# Patient Record
Sex: Female | Born: 2000 | Race: Black or African American | Hispanic: No | Marital: Single | Smoking: Never smoker
Health system: Southern US, Community
[De-identification: ages and names within clinical notes are randomized; demographics above are authoritative.]

---
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
## 2019-04-11 LAB — OB RESULTS CONSOLE HIV ANTIBODY (ROUTINE TESTING): HIV: NONREACTIVE

## 2019-04-11 LAB — OB RESULTS CONSOLE GC/CHLAMYDIA
Chlamydia: NEGATIVE
Gonorrhea: NEGATIVE

## 2019-04-11 LAB — OB RESULTS CONSOLE HEPATITIS B SURFACE ANTIGEN: Hepatitis B Surface Ag: NEGATIVE

## 2019-04-11 LAB — OB RESULTS CONSOLE HGB/HCT, BLOOD
HCT: 32 (ref 29–41)
Hemoglobin: 10.5

## 2019-04-11 LAB — OB RESULTS CONSOLE RPR: RPR: NONREACTIVE

## 2019-04-11 LAB — OB RESULTS CONSOLE RUBELLA ANTIBODY, IGM: Rubella: IMMUNE

## 2019-04-11 LAB — OB RESULTS CONSOLE ABO/RH: RH Type: POSITIVE

## 2019-04-11 LAB — OB RESULTS CONSOLE PLATELET COUNT: Platelets: 367

## 2019-05-04 ENCOUNTER — Encounter: Payer: Self-pay | Admitting: Certified Nurse Midwife

## 2019-05-04 ENCOUNTER — Other Ambulatory Visit: Payer: Self-pay

## 2019-05-04 ENCOUNTER — Ambulatory Visit (INDEPENDENT_AMBULATORY_CARE_PROVIDER_SITE_OTHER): Payer: Self-pay | Admitting: Certified Nurse Midwife

## 2019-05-04 VITALS — BP 96/59 | HR 88 | Wt 171.2 lb

## 2019-05-04 DIAGNOSIS — Z87898 Personal history of other specified conditions: Secondary | ICD-10-CM

## 2019-05-04 DIAGNOSIS — O09893 Supervision of other high risk pregnancies, third trimester: Secondary | ICD-10-CM

## 2019-05-04 DIAGNOSIS — Z3403 Encounter for supervision of normal first pregnancy, third trimester: Secondary | ICD-10-CM

## 2019-05-04 DIAGNOSIS — Z832 Family history of diseases of the blood and blood-forming organs and certain disorders involving the immune mechanism: Secondary | ICD-10-CM

## 2019-05-04 DIAGNOSIS — Z789 Other specified health status: Secondary | ICD-10-CM

## 2019-05-04 DIAGNOSIS — O2342 Unspecified infection of urinary tract in pregnancy, second trimester: Secondary | ICD-10-CM

## 2019-05-04 DIAGNOSIS — O99013 Anemia complicating pregnancy, third trimester: Secondary | ICD-10-CM

## 2019-05-04 DIAGNOSIS — Z3A33 33 weeks gestation of pregnancy: Secondary | ICD-10-CM

## 2019-05-04 LAB — POCT URINALYSIS DIPSTICK OB
Bilirubin, UA: NEGATIVE
Blood, UA: NEGATIVE
Glucose, UA: NEGATIVE
Ketones, UA: NEGATIVE
Leukocytes, UA: NEGATIVE
Nitrite, UA: NEGATIVE
POC,PROTEIN,UA: NEGATIVE
Spec Grav, UA: 1.01 (ref 1.010–1.025)
Urobilinogen, UA: 0.2 E.U./dL
pH, UA: 5 (ref 5.0–8.0)

## 2019-05-04 NOTE — Patient Instructions (Signed)
Contraception Choices Contraception, also called birth control, means things to use or ways to try not to get pregnant. Hormonal birth control This kind of birth control uses hormones. Here are some types of hormonal birth control:  A tube that is put under skin of the arm (implant). The tube can stay in for as long as 3 years.  Shots to get every 3 months (injections).  Pills to take every day (birth control pills).  A patch to change 1 time each week for 3 weeks (birth control patch). After that, the patch is taken off for 1 week.  A ring to put in the vagina. The ring is left in for 3 weeks. Then it is taken out of the vagina for 1 week. Then a new ring is put in.  Pills to take after unprotected sex (emergency birth control pills). Barrier birth control Here are some types of barrier birth control:  A thin covering that is put on the penis before sex (female condom). The covering is thrown away after sex.  A soft, loose covering that is put in the vagina before sex (female condom). The covering is thrown away after sex.  A rubber bowl that sits over the cervix (diaphragm). The bowl must be made for you. The bowl is put into the vagina before sex. The bowl is left in for 6-8 hours after sex. It is taken out within 24 hours.  A small, soft cup that fits over the cervix (cervical cap). The cup must be made for you. The cup can be left in for 6-8 hours after sex. It is taken out within 48 hours.  A sponge that is put into the vagina before sex. It must be left in for at least 6 hours after sex. It must be taken out within 30 hours. Then it is thrown away.  A chemical that kills or stops sperm from getting into the uterus (spermicide). It may be a pill, cream, jelly, or foam to put in the vagina. The chemical should be used at least 10-15 minutes before sex. IUD (intrauterine) birth control An IUD is a small, T-shaped piece of plastic. It is put inside the uterus. There are two  kinds:  Hormone IUD. This kind can stay in for 3-5 years.  Copper IUD. This kind can stay in for 10 years. Permanent birth control Here are some types of permanent birth control:  Surgery to block the fallopian tubes.  Having an insert put into each fallopian tube.  Surgery to tie off the tubes that carry sperm (vasectomy). Natural planning birth control Here are some types of natural planning birth control:  Not having sex on the days the woman could get pregnant.  Using a calendar: ? To keep track of the length of each period. ? To find out what days pregnancy can happen. ? To plan to not have sex on days when pregnancy can happen.  Watching for symptoms of ovulation and not having sex during ovulation. One way the woman can check for ovulation is to check her temperature.  Waiting to have sex until after ovulation. Summary  Contraception, also called birth control, means things to use or ways to try not to get pregnant.  Hormonal methods of birth control include implants, injections, pills, patches, vaginal rings, and emergency birth control pills.  Barrier methods of birth control can include female condoms, female condoms, diaphragms, cervical caps, sponges, and spermicides.  There are two types of IUD (intrauterine device) birth control.  An IUD can be put in a woman's uterus to prevent pregnancy for 3-5 years.  Permanent sterilization can be done through a procedure for males, females, or both.  Natural planning methods involve not having sex on the days when the woman could get pregnant. This information is not intended to replace advice given to you by your health care provider. Make sure you discuss any questions you have with your health care provider. Document Revised: 06/08/2018 Document Reviewed: 02/27/2016 Elsevier Patient Education  [place].   Pain Relief During Labor and Delivery Many things can cause pain during labor and delivery,  including:  Pressure on bones and ligaments due to the baby moving through the pelvis.  Stretching of tissues due to the baby moving through the birth canal.  Muscle tension due to anxiety or nervousness.  The uterus tightening (contracting) and relaxing to help move the baby. There are many ways to deal with the pain of labor and delivery. They include:  Taking prenatal classes. Taking these classes helps you know what to expect during your baby's birth. What you learn will increase your confidence and decrease your anxiety.  Practicing relaxation techniques or doing relaxing activities, such as: ? Focused breathing. ? Meditation. ? Visualization. ? Aroma therapy. ? Listening to your favorite music. ? Hypnosis.  Taking a warm shower or bath (hydrotherapy). This may: ? Provide comfort and relaxation. ? Lessen your perception of pain. ? Decrease the amount of pain medicine needed. ? Decrease the length of labor.  Getting a massage or counterpressure on your back.  Applying warm packs or ice packs.  Changing positions often, moving around, or using a birthing ball.  Getting: ? Pain medicine through an IV or injection into a muscle. ? Pain medicine inserted into your spinal column. ? Injections of sterile water just under the skin on your lower back (intradermal injections). ? Laughing gas (nitrous oxide). Discuss your pain control options with your health care provider during your prenatal visits. Explore the options offered by your hospital or birth center. What kinds of medicine are available? There are two kinds of medicines that can be used to relieve pain during labor and delivery:  Analgesics. These medicines decrease pain without causing you to lose feeling or the ability to move your muscles.  Anesthetics. These medicines block feeling in the body and can decrease your ability to move freely. Both of these kinds of medicine can cause minor side effects, such as  nausea, trouble concentrating, and sleepiness. They can also decrease the baby's heart rate before birth and affect the baby's breathing rate after birth. For this reason, health care providers are careful about when and how much medicine is given. What are specific medicines and procedures that provide pain relief? Local Anesthetics Local anesthetics are used to numb a small area of the body. They may be used along with another kind of anesthetic or used to numb the nerves of the vagina, cervix, and perineum during the second stage of labor. General Anesthetics General anesthetics cause you to lose consciousness so you do not feel pain. They are usually only used for an emergency cesarean delivery. General anesthetics are given through an IV tube and a mask. Pudendal Block A pudendal block is a form of local anesthetic. It may be used to relieve the pain associated with pushing or stretching of the perineum at the time of delivery or to further numb the perineum. A pudendal block is done by injecting numbing medicine through  the vaginal wall into a nerve in the pelvis. Epidural Analgesia Epidural analgesia is given through a flexible IV catheter that is inserted into the lower back. Numbing medicine is delivered continuously to the area near your spinal column nerves (epidural space). After having this type of analgesia, you may be able to move your legs but you most likely will not be able to walk. Depending on the amount of medicine given, you may lose all feeling in the lower half of your body, or you may retain some level of sensation, including the urge to push. Epidural analgesia can be used to provide pain relief for a vaginal birth. Spinal Block A spinal block is similar to epidural analgesia, but the medicine is injected into the spinal fluid instead of the epidural space. A spinal block is only given once. It starts to relieve pain quickly, but the pain relief lasts only 1-6 hours. Spinal  blocks can be used for cesarean deliveries. Combined Spinal-Epidural (CSE) Block A CSE block combines the effects of a spinal block and epidural analgesia. The spinal block works quickly to block all pain. The epidural analgesia provides continuous pain relief, even after the effects of the spinal block have worn off. This information is not intended to replace advice given to you by your health care provider. Make sure you discuss any questions you have with your health care provider. Document Revised: 01/29/2017 Document Reviewed: 07/10/2015 Elsevier Patient Education  [place].   Breastfeeding  Choosing to breastfeed is one of the best decisions you can make for yourself and your baby. A change in hormones during pregnancy causes your breasts to make breast milk in your milk-producing glands. Hormones prevent breast milk from being released before your baby is born. They also prompt milk flow after birth. Once breastfeeding has begun, thoughts of your baby, as well as his or her sucking or crying, can stimulate the release of milk from your milk-producing glands. Benefits of breastfeeding Research shows that breastfeeding offers many health benefits for infants and mothers. It also offers a cost-free and convenient way to feed your baby. For your baby  Your first milk (colostrum) helps your baby's digestive system to function better.  Special cells in your milk (antibodies) help your baby to fight off infections.  Breastfed babies are less likely to develop asthma, allergies, obesity, or type 2 diabetes. They are also at lower risk for sudden infant death syndrome (SIDS).  Nutrients in breast milk are better able to meet your baby's needs compared to infant formula.  Breast milk improves your baby's brain development. For you  Breastfeeding helps to create a very special bond between you and your baby.  Breastfeeding is convenient. Breast milk costs nothing and is always  available at the correct temperature.  Breastfeeding helps to burn calories. It helps you to lose the weight that you gained during pregnancy.  Breastfeeding makes your uterus return faster to its size before pregnancy. It also slows bleeding (lochia) after you give birth.  Breastfeeding helps to lower your risk of developing type 2 diabetes, osteoporosis, rheumatoid arthritis, cardiovascular disease, and breast, ovarian, uterine, and endometrial cancer later in life. Breastfeeding basics Starting breastfeeding  Find a comfortable place to sit or lie down, with your neck and back well-supported.  Place a pillow or a rolled-up blanket under your baby to bring him or her to the level of your breast (if you are seated). Nursing pillows are specially designed to help support your arms and  your baby while you breastfeed.  Make sure that your baby's tummy (abdomen) is facing your abdomen.  Gently massage your breast. With your fingertips, massage from the outer edges of your breast inward toward the nipple. This encourages milk flow. If your milk flows slowly, you may need to continue this action during the feeding.  Support your breast with 4 fingers underneath and your thumb above your nipple (make the letter "C" with your hand). Make sure your fingers are well away from your nipple and your baby's mouth.  Stroke your baby's lips gently with your finger or nipple.  When your baby's mouth is open wide enough, quickly bring your baby to your breast, placing your entire nipple and as much of the areola as possible into your baby's mouth. The areola is the colored area around your nipple. ? More areola should be visible above your baby's upper lip than below the lower lip. ? Your baby's lips should be opened and extended outward (flanged) to ensure an adequate, comfortable latch. ? Your baby's tongue should be between his or her lower gum and your breast.  Make sure that your baby's mouth is  correctly positioned around your nipple (latched). Your baby's lips should create a seal on your breast and be turned out (everted).  It is common for your baby to suck about 2-3 minutes in order to start the flow of breast milk. Latching Teaching your baby how to latch onto your breast properly is very important. An improper latch can cause nipple pain, decreased milk supply, and poor weight gain in your baby. Also, if your baby is not latched onto your nipple properly, he or she may swallow some air during feeding. This can make your baby fussy. Burping your baby when you switch breasts during the feeding can help to get rid of the air. However, teaching your baby to latch on properly is still the best way to prevent fussiness from swallowing air while breastfeeding. Signs that your baby has successfully latched onto your nipple  Silent tugging or silent sucking, without causing you pain. Infant's lips should be extended outward (flanged).  Swallowing heard between every 3-4 sucks once your milk has started to flow (after your let-down milk reflex occurs).  Muscle movement above and in front of his or her ears while sucking. Signs that your baby has not successfully latched onto your nipple  Sucking sounds or smacking sounds from your baby while breastfeeding.  Nipple pain. If you think your baby has not latched on correctly, slip your finger into the corner of your baby's mouth to break the suction and place it between your baby's gums. Attempt to start breastfeeding again. Signs of successful breastfeeding Signs from your baby  Your baby will gradually decrease the number of sucks or will completely stop sucking.  Your baby will fall asleep.  Your baby's body will relax.  Your baby will retain a small amount of milk in his or her mouth.  Your baby will let go of your breast by himself or herself. Signs from you  Breasts that have increased in firmness, weight, and size 1-3 hours  after feeding.  Breasts that are softer immediately after breastfeeding.  Increased milk volume, as well as a change in milk consistency and color by the fifth day of breastfeeding.  Nipples that are not sore, cracked, or bleeding. Signs that your baby is getting enough milk  Wetting at least 1-2 diapers during the first 24 hours after birth.  Wetting at least 5-6 diapers every 24 hours for the first week after birth. The urine should be clear or pale yellow by the age of 5 days.  Wetting 6-8 diapers every 24 hours as your baby continues to grow and develop.  At least 3 stools in a 24-hour period by the age of 5 days. The stool should be soft and yellow.  At least 3 stools in a 24-hour period by the age of 7 days. The stool should be seedy and yellow.  No loss of weight greater than 10% of birth weight during the first 3 days of life.  Average weight gain of 4-7 oz (113-198 g) per week after the age of 4 days.  Consistent daily weight gain by the age of 5 days, without weight loss after the age of 2 weeks. After a feeding, your baby may spit up a small amount of milk. This is normal. Breastfeeding frequency and duration Frequent feeding will help you make more milk and can prevent sore nipples and extremely full breasts (breast engorgement). Breastfeed when you feel the need to reduce the fullness of your breasts or when your baby shows signs of hunger. This is called "breastfeeding on demand." Signs that your baby is hungry include:  Increased alertness, activity, or restlessness.  Movement of the head from side to side.  Opening of the mouth when the corner of the mouth or cheek is stroked (rooting).  Increased sucking sounds, smacking lips, cooing, sighing, or squeaking.  Hand-to-mouth movements and sucking on fingers or hands.  Fussing or crying. Avoid introducing a pacifier to your baby in the first 4-6 weeks after your baby is born. After this time, you may choose to use  a pacifier. Research has shown that pacifier use during the first year of a baby's life decreases the risk of sudden infant death syndrome (SIDS). Allow your baby to feed on each breast as long as he or she wants. When your baby unlatches or falls asleep while feeding from the first breast, offer the second breast. Because newborns are often sleepy in the first few weeks of life, you may need to awaken your baby to get him or her to feed. Breastfeeding times will vary from baby to baby. However, the following rules can serve as a guide to help you make sure that your baby is properly fed:  Newborns (babies 50 weeks of age or younger) may breastfeed every 1-3 hours.  Newborns should not go without breastfeeding for longer than 3 hours during the day or 5 hours during the night.  You should breastfeed your baby a minimum of 8 times in a 24-hour period. Breast milk pumping     Pumping and storing breast milk allows you to make sure that your baby is exclusively fed your breast milk, even at times when you are unable to breastfeed. This is especially important if you go back to work while you are still breastfeeding, or if you are not able to be present during feedings. Your lactation consultant can help you find a method of pumping that works best for you and give you guidelines about how long it is safe to store breast milk. Caring for your breasts while you breastfeed Nipples can become dry, cracked, and sore while breastfeeding. The following recommendations can help keep your breasts moisturized and healthy:  Avoid using soap on your nipples.  Wear a supportive bra designed especially for nursing. Avoid wearing underwire-style bras or extremely tight bras (sports bras).  Air-dry your nipples for 3-4 minutes after each feeding.  Use only cotton bra pads to absorb leaked breast milk. Leaking of breast milk between feedings is normal.  Use lanolin on your nipples after breastfeeding. Lanolin  helps to maintain your skin's normal moisture barrier. Pure lanolin is not harmful (not toxic) to your baby. You may also hand express a few drops of breast milk and gently massage that milk into your nipples and allow the milk to air-dry. In the first few weeks after giving birth, some women experience breast engorgement. Engorgement can make your breasts feel heavy, warm, and tender to the touch. Engorgement peaks within 3-5 days after you give birth. The following recommendations can help to ease engorgement:  Completely empty your breasts while breastfeeding or pumping. You may want to start by applying warm, moist heat (in the shower or with warm, water-soaked hand towels) just before feeding or pumping. This increases circulation and helps the milk flow. If your baby does not completely empty your breasts while breastfeeding, pump any extra milk after he or she is finished.  Apply ice packs to your breasts immediately after breastfeeding or pumping, unless this is too uncomfortable for you. To do this: ? Put ice in a plastic bag. ? Place a towel between your skin and the bag. ? Leave the ice on for 20 minutes, 2-3 times a day.  Make sure that your baby is latched on and positioned properly while breastfeeding. If engorgement persists after 48 hours of following these recommendations, contact your health care provider or a Science writer. Overall health care recommendations while breastfeeding  Eat 3 healthy meals and 3 snacks every day. Well-nourished mothers who are breastfeeding need an additional 450-500 calories a day. You can meet this requirement by increasing the amount of a balanced diet that you eat.  Drink enough water to keep your urine pale yellow or clear.  Rest often, relax, and continue to take your prenatal vitamins to prevent fatigue, stress, and low vitamin and mineral levels in your body (nutrient deficiencies).  Do not use any products that contain nicotine or  tobacco, such as cigarettes and e-cigarettes. Your baby may be harmed by chemicals from cigarettes that pass into breast milk and exposure to secondhand smoke. If you need help quitting, ask your health care provider.  Avoid alcohol.  Do not use illegal drugs or marijuana.  Talk with your health care provider before taking any medicines. These include over-the-counter and prescription medicines as well as vitamins and herbal supplements. Some medicines that may be harmful to your baby can pass through breast milk.  It is possible to become pregnant while breastfeeding. If birth control is desired, ask your health care provider about options that will be safe while breastfeeding your baby. Where to find more information: [employer]: www.llli.org Contact a health care provider if:  You feel like you want to stop breastfeeding or have become frustrated with breastfeeding.  Your nipples are cracked or bleeding.  Your breasts are red, tender, or warm.  You have: ? Painful breasts or nipples. ? A swollen area on either breast. ? A fever or chills. ? Nausea or vomiting. ? Drainage other than breast milk from your nipples.  Your breasts do not become full before feedings by the fifth day after you give birth.  You feel sad and depressed.  Your baby is: ? Too sleepy to eat well. ? Having trouble sleeping. ? More than 12 week old and wetting  fewer than 6 diapers in a 24-hour period. ? Not gaining weight by 21 days of age.  Your baby has fewer than 3 stools in a 24-hour period.  Your baby's skin or the white parts of his or her eyes become yellow. Get help right away if:  Your baby is overly tired (lethargic) and does not want to wake up and feed.  Your baby develops an unexplained fever. Summary  Breastfeeding offers many health benefits for infant and mothers.  Try to breastfeed your infant when he or she shows early signs of hunger.  Gently tickle or stroke  your baby's lips with your finger or nipple to allow the baby to open his or her mouth. Bring the baby to your breast. Make sure that much of the areola is in your baby's mouth. Offer one side and burp the baby before you offer the other side.  Talk with your health care provider or lactation consultant if you have questions or you face problems as you breastfeed. This information is not intended to replace advice given to you by your health care provider. Make sure you discuss any questions you have with your health care provider. Document Revised: 05/13/2017 Document Reviewed: 03/20/2016 Elsevier Patient Education  2020 [employer].   [facility]  [place], [place], [place] [address]  Phone: [phone]   [facility] (second location)  [address] [place], [place] [address]  Phone: [phone]   [facility]) [place], [place], [place] [address] Phone: [phone]   [address] [place], [place] [address]  Phone: [phone]  Fetal Movement Counts Patient Name: ________________________________________________ Patient Due Date: ____________________ What is a fetal movement count?  A fetal movement count is the number of times that you feel your baby move during a certain amount of time. This may also be called a fetal kick count. A fetal movement count is recommended for every pregnant woman. You may be asked to start counting fetal movements as early as week 28 of your pregnancy. Pay attention to when your baby is most active. You may notice your baby's sleep and wake cycles. You may also notice things that make your baby move more. You should do a fetal movement count:  When your baby is normally most active.  At the same time each day. A good time to count movements is while you are resting, after having something to eat and drink. How do I count fetal  movements? 1. Find a quiet, comfortable area. Sit, or lie down on your side. 2. Write down the date, the start time and stop time, and the number of movements that you felt between those two times. Take this information with you to your health care visits. 3. Write down your start time when you feel the first movement. 4. Count kicks, flutters, swishes, rolls, and jabs. You should feel at least 10 movements. 5. You may stop counting after you have felt 10 movements, or if you have been counting for 2 hours. Write down the stop time. 6. If you do not feel 10 movements in 2 hours, contact your health care provider for further instructions. Your health care provider may want to do additional tests to assess your baby's well-being. Contact a health care provider if:  You feel fewer than 10 movements in 2 hours.  Your baby is not moving like he or she usually does. Date: ____________  Start time: ____________ Stop time: ____________ Movements: ____________ Date: ____________ Start time: ____________ Stop time: ____________ Movements: ____________ Date: ____________ Start time: ____________ Stop time: ____________ Movements: ____________ Date: ____________ Start time: ____________ Stop time: ____________ Movements: ____________ Date: ____________ Start time: ____________ Stop time: ____________ Movements: ____________ Date: ____________ Start time: ____________ Stop time: ____________ Movements: ____________ Date: ____________ Start time: ____________ Stop time: ____________ Movements: ____________ Date: ____________ Start time: ____________ Stop time: ____________ Movements: ____________ Date: ____________ Start time: ____________ Stop time: ____________ Movements: ____________ This information is not intended to replace advice given to you by your health care provider. Make sure you discuss any questions you have with your health care provider. Document Revised: 10/06/2018 Document Reviewed:  10/06/2018 Elsevier Patient Education  [place]. Common Medications Safe in Pregnancy  Acne:      Constipation:  Benzoyl Peroxide     Colace  Clindamycin      Dulcolax Suppository  Topica Erythromycin     Fibercon  Salicylic Acid      Metamucil         Miralax AVOID:        Senakot   Accutane    Cough:  Retin-A       Cough Drops  Tetracycline      Phenergan w/ Codeine if Rx  Minocycline      Robitussin (Plain & DM)  Antibiotics:     Crabs/Lice:  Ceclor       RID  Cephalosporins    AVOID:  E-Mycins      Kwell  Keflex  Macrobid/Macrodantin   Diarrhea:  Penicillin      Kao-Pectate  Zithromax      Imodium AD         PUSH FLUIDS AVOID:       Cipro     Fever:  Tetracycline      Tylenol (Regular or Extra  Minocycline       Strength)  Levaquin      Extra Strength-Do not          Exceed 8 tabs/24 hrs Caffeine:        '200mg'$ /day (equiv. To 1 cup of coffee or  approx. 3 12 oz sodas)         Gas: Cold/Hayfever:       Gas-X  Benadryl      Mylicon  Claritin       Phazyme  **Claritin-D        Chlor-Trimeton    Headaches:  Dimetapp      ASA-Free Excedrin  Drixoral-Non-Drowsy     Cold Compress  Mucinex (Guaifenasin)     Tylenol (Regular or Extra  Sudafed/Sudafed-12 Hour     Strength)  **Sudafed PE Pseudoephedrine   Tylenol Cold & Sinus     Vicks Vapor Rub  Zyrtec  **AVOID if Problems With Blood Pressure         Heartburn: Avoid lying down for at least 1 hour after meals  Aciphex      Maalox     Rash:  Milk of Magnesia     Benadryl    Mylanta       1% Hydrocortisone Cream  Pepcid  Pepcid Complete   Sleep Aids:  Prevacid      Ambien   Prilosec       Benadryl  Rolaids       Chamomile Tea  Tums (Limit 4/day)     Unisom  Zantac       Tylenol PM  Warm milk-add vanilla or  Hemorrhoids:       Sugar for taste  Anusol/Anusol H.C.  (RX: Analapram 2.5%)  Sugar Substitutes:  Hydrocortisone OTC     Ok in moderation  Preparation  H      Tucks        Vaseline lotion applied to tissue with wiping    Herpes:     Throat:  Acyclovir      Oragel  Famvir  Valtrex     Vaccines:         Flu Shot Leg Cramps:       *Gardasil  Benadryl      Hepatitis A         Hepatitis B Nasal Spray:       Pneumovax  Saline Nasal Spray     Polio Booster         Tetanus Nausea:       Tuberculosis test or PPD  Vitamin B6 25 mg TID   AVOID:    Dramamine      *Gardasil  Emetrol       Live Poliovirus  Ginger Root 250 mg QID    MMR (measles, mumps &  High Complex Carbs @ Bedtime    rebella)  Sea Bands-Accupressure    Varicella (Chickenpox)  Unisom 1/2 tab TID     *No known complications           If received before Pain:         Known pregnancy;   Darvocet       Resume series after  Lortab        Delivery  Percocet    Yeast:   Tramadol      Femstat  Tylenol 3      Gyne-lotrimin  Ultram       Monistat  Vicodin           MISC:         All Sunscreens           Hair Coloring/highlights          Insect Repellant's          (Including DEET)         Mystic Tans Third Trimester of Pregnancy  The third trimester is from week 28 through week 40 (months 7 through 9). This trimester is when your unborn baby (fetus) is growing very fast. At the end of the ninth month, the unborn baby is about 20 inches in length. It weighs about 6-10 pounds. Follow these instructions at home: Medicines  Take over-the-counter and prescription medicines only as told by your doctor. Some medicines are safe and some medicines are not safe during pregnancy.  Take a prenatal vitamin that contains at least 600 micrograms (mcg) of folic acid.  If you have trouble pooping (constipation), take medicine that will make your stool soft (stool softener) if your doctor approves. Eating and drinking   Eat regular, healthy meals.  Avoid raw meat and uncooked cheese.  If you get low calcium from the food you eat, talk to your doctor about taking a daily calcium  supplement.  Eat four or five small meals rather than three large meals a day.  Avoid foods that are high in fat and sugars, such as fried and sweet foods.  To prevent constipation: ? Eat foods that are high in fiber, like fresh fruits and vegetables, whole grains, and beans. ? Drink enough fluids to keep your pee (urine) clear or pale yellow.  Activity  Exercise only as told by your doctor. Stop exercising if you start to have cramps.  Avoid heavy lifting, wear low heels, and sit up straight.  Do not exercise if it is too hot, too humid, or if you are in a place of great height (high altitude).  You may continue to have sex unless your doctor tells you not to. Relieving pain and discomfort  Wear a good support bra if your breasts are tender.  Take frequent breaks and rest with your legs raised if you have leg cramps or low back pain.  Take warm water baths (sitz baths) to soothe pain or discomfort caused by hemorrhoids. Use hemorrhoid cream if your doctor approves.  If you develop puffy, bulging veins (varicose veins) in your legs: ? Wear support hose or compression stockings as told by your doctor. ? Raise (elevate) your feet for 15 minutes, 3-4 times a day. ? Limit salt in your food. Safety  Wear your seat belt when driving.  Make a list of emergency phone numbers, including numbers for family, friends, the hospital, and police and fire departments. Preparing for your baby's arrival To prepare for the arrival of your baby:  Take prenatal classes.  Practice driving to the hospital.  Visit the hospital and tour the maternity area.  Talk to your work about taking leave once the baby comes.  Pack your hospital bag.  Prepare the baby's room.  Go to your doctor visits.  Buy a rear-facing car seat. Learn how to install it in your car. General instructions  Do not use hot tubs, steam rooms, or saunas.  Do not use any products that contain nicotine or tobacco, such  as cigarettes and e-cigarettes. If you need help quitting, ask your doctor.  Do not drink alcohol.  Do not douche or use tampons or scented sanitary pads.  Do not cross your legs for long periods of time.  Do not travel for long distances unless you must. Only do so if your doctor says it is okay.  Visit your dentist if you have not gone during your pregnancy. Use a soft toothbrush to brush your teeth. Be gentle when you floss.  Avoid cat litter boxes and soil used by cats. These carry germs that can cause birth defects in the baby and can cause a loss of your baby (miscarriage) or stillbirth.  Keep all your prenatal visits as told by your doctor. This is important. Contact a doctor if:  You are not sure if you are in labor or if your water has broken.  You are dizzy.  You have mild cramps or pressure in your lower belly.  You have a nagging pain in your belly area.  You continue to feel sick to your stomach, you throw up, or you have watery poop.  You have bad smelling fluid coming from your vagina.  You have pain when you pee. Get help right away if:  You have a fever.  You are leaking fluid from your vagina.  You are spotting or bleeding from your vagina.  You have severe belly cramps or pain.  You lose or gain weight quickly.  You have trouble catching your breath and have chest pain.  You notice sudden or extreme puffiness (swelling) of your face, hands, ankles, feet, or legs.  You have not felt the baby move in over an hour.  You have severe headaches that do not go away with medicine.  You have trouble seeing.  You are  leaking, or you are having a gush of fluid, from your vagina before you are 37 weeks.  You have regular belly spasms (contractions) before you are 37 weeks. Summary  The third trimester is from week 28 through week 40 (months 7 through 9). This time is when your unborn baby is growing very fast.  Follow your doctor's advice about  medicine, food, and activity.  Get ready for the arrival of your baby by taking prenatal classes, getting all the baby items ready, preparing the baby's room, and visiting your doctor to be checked.  Get help right away if you are bleeding from your vagina, or you have chest pain and trouble catching your breath, or if you have not felt your baby move in over an hour. This information is not intended to replace advice given to you by your health care provider. Make sure you discuss any questions you have with your health care provider. Document Revised: 06/09/2018 Document Reviewed: 03/24/2016 Elsevier Patient Education  [place].

## 2019-05-05 ENCOUNTER — Telehealth: Payer: Self-pay | Admitting: Certified Nurse Midwife

## 2019-05-05 ENCOUNTER — Other Ambulatory Visit: Payer: Self-pay

## 2019-05-05 MED ORDER — METRONIDAZOLE 0.75 % VA GEL: 1.0000 | Freq: Every day | VAGINAL | 1 refills | Status: DC

## 2019-05-05 NOTE — Telephone Encounter (Signed)
Please send Metrogel to pharmacy as requested. Thanks, JML

## 2019-05-05 NOTE — Telephone Encounter (Signed)
Per mychart request- metrogel sent to [facility] in [place] per Claretta Fraise request

## 2019-05-05 NOTE — Telephone Encounter (Signed)
Patient called saying Kendra Hunt prescribed her a gel for bacterial infections but patient left without asking what pharmacy it was sent to. The patient said she would prefer it be sent to the [facility] pharmacy in [place].

## 2019-05-07 DIAGNOSIS — Z87898 Personal history of other specified conditions: Secondary | ICD-10-CM | POA: Insufficient documentation

## 2019-05-07 DIAGNOSIS — O99013 Anemia complicating pregnancy, third trimester: Secondary | ICD-10-CM | POA: Insufficient documentation

## 2019-05-07 DIAGNOSIS — Z832 Family history of diseases of the blood and blood-forming organs and certain disorders involving the immune mechanism: Secondary | ICD-10-CM | POA: Insufficient documentation

## 2019-05-07 DIAGNOSIS — O2342 Unspecified infection of urinary tract in pregnancy, second trimester: Secondary | ICD-10-CM | POA: Insufficient documentation

## 2019-05-07 DIAGNOSIS — Z789 Other specified health status: Secondary | ICD-10-CM | POA: Insufficient documentation

## 2019-05-07 NOTE — Progress Notes (Signed)
TRANSFER IN OB HISTORY AND PHYSICAL  SUBJECTIVE:       Kendra Hunt is a 19 y.o. G1P0 female, Patient's last menstrual period was 09/11/2018., Estimated Date of Delivery: 06/19/19, [redacted]w[redacted]d, presents today for Transition of Prenatal Care. EPIC data migration from outside records is accomplished today.  Relocated from [place], [place] to [place] to be closer to mother and family.   Denies difficulty breathing or respiratory distress, chest pain, abdominal pain, dysuria, vaginal bleeding, and leg pain or swelling.   Requests prescription for Metrogel. Desires midwifery care.    Gynecologic History  Patient's last menstrual period was 09/11/2018.   Contraception: none  Last Pap:N/A  Obstetric History  OB History  Gravida Para Term Preterm AB Living  1            SAB TAB Ectopic Multiple Live Births               # Outcome Date GA Lbr Len/2nd Weight Sex Delivery Anes PTL Lv  1 Current             History reviewed. No pertinent past medical history.  History reviewed. No pertinent surgical history.  Current Outpatient Medications on File Prior to Visit  Medication Sig Dispense Refill  . cephALEXin (KEFLEX) 500 MG capsule Take 1 capsule (500 mg total) by mouth 3 (three) times daily. 21 capsule 0  . ondansetron (ZOFRAN ODT) 4 MG disintegrating tablet Take 1 tablet (4 mg total) by mouth every 8 (eight) hours as needed for nausea or vomiting. 20 tablet 0  . promethazine (PHENERGAN) 12.5 MG tablet Take 1 tablet (12.5 mg total) by mouth every 6 (six) hours as needed for nausea or vomiting. 30 tablet 0   No current facility-administered medications on file prior to visit.    No Known Allergies  Social History   Socioeconomic History  . Marital status: Single    Spouse name: Not on file  . Number of children: Not on file  . Years of education: Not on file  . Highest education level: Not on file  Occupational History  . Not on file  Tobacco Use  . Smoking status: Never Smoker   . Smokeless tobacco: Never Used  Substance and Sexual Activity  . Alcohol use: Never  . Drug use: Never  . Sexual activity: Yes    Birth control/protection: None  Other Topics Concern  . Not on file  Social History Narrative  . Not on file   Social Determinants of Health   Financial Resource Strain:   . Difficulty of Paying Living Expenses: Not on file  Food Insecurity:   . Worried About Charity fundraiser in the Last Year: Not on file  . Ran Out of Food in the Last Year: Not on file  Transportation Needs:   . Lack of Transportation (Medical): Not on file  . Lack of Transportation (Non-Medical): Not on file  Physical Activity:   . Days of Exercise per Week: Not on file  . Minutes of Exercise per Session: Not on file  Stress:   . Feeling of Stress : Not on file  Social Connections:   . Frequency of Communication with Friends and Family: Not on file  . Frequency of Social Gatherings with Friends and Family: Not on file  . Attends Religious Services: Not on file  . Active Member of Clubs or Organizations: Not on file  . Attends Archivist Meetings: Not on file  . Marital Status: Not on file  Intimate Partner Violence:   . Fear of Current or Ex-Partner: Not on file  . Emotionally Abused: Not on file  . Physically Abused: Not on file  . Sexually Abused: Not on file    History reviewed. No pertinent family history.  The following portions of the patient's history were reviewed and updated as appropriate: allergies, current medications, past OB history, past medical history, past surgical history, past family history, past social history, and problem list.  Review of Systems:  ROS negative except as noted above. Information obtained from patient.   OBJECTIVE:  BP (!) 96/59   Pulse 88   Wt 171 lb 3 oz (77.7 kg)   LMP 09/11/2018   BMI 27.63 kg/m   Initial Physical Exam (New OB)  GENERAL APPEARANCE: alert, well appearing, in no apparent distress  HEAD:  normocephalic, atraumatic  MOUTH: mucous membranes moist, pharynx normal without lesions  THYROID: no thyromegaly or masses present  BREASTS: deferred, no complaints  LUNGS: clear to auscultation, no wheezes, rales or rhonchi, symmetric air entry  HEART: regular rate and rhythm, no murmurs  ABDOMEN: soft, nontender, nondistended, no abnormal masses, no epigastric pain, fundus soft, nontender 34 cm and FHT present  EXTREMITIES: no redness or tenderness in the calves or thighs, no edema  SKIN: normal coloration and turgor, no rashes, tattoos present  LYMPH NODES: no adenopathy palpable  NEUROLOGIC: alert, oriented, normal speech, no focal findings or movement disorder noted  PELVIC EXAM: declined  ASSESSMENT: Normal pregnancy, desires midwifery care Rh positive Anemia in pregnancy History of UTI History nonepileptic seizures Family history of sickle cell trait Varicella immunization status unknown  PLAN: Prenatal care New OB counseling: The patient has been given an overview regarding routine prenatal care. Recommendations regarding diet, weight gain, and exercise in pregnancy were given. Prenatal testing, optional genetic testing, and ultrasound use in pregnancy were reviewed.  Benefits of Breast Feeding were discussed. The patient is encouraged to consider nursing her baby post partum. See orders   Pregnancy Medical Home Form completed.    A total of 20 minutes were spent face-to-face with the patient during the encounter with greater than 50% dealing with counseling and coordination of care.

## 2019-05-08 ENCOUNTER — Other Ambulatory Visit (INDEPENDENT_AMBULATORY_CARE_PROVIDER_SITE_OTHER): Payer: Self-pay | Admitting: Certified Nurse Midwife

## 2019-05-08 DIAGNOSIS — Z3689 Encounter for other specified antenatal screening: Secondary | ICD-10-CM

## 2019-05-08 DIAGNOSIS — Z3403 Encounter for supervision of normal first pregnancy, third trimester: Secondary | ICD-10-CM

## 2019-05-08 NOTE — Progress Notes (Signed)
Order placed for follow up ultrasound due to incomplete outside anatomy scan.    Gunnar Bulla, CNM [facility], [facility] 05/08/19 10:12 AM

## 2019-05-08 NOTE — Telephone Encounter (Signed)
Please contact patient to add ultrasound for incomplete views of fetal heart and nasal spine on anatomy scan with next visit. Order is in. Thanks, JML

## 2019-05-17 ENCOUNTER — Other Ambulatory Visit: Payer: Self-pay

## 2019-05-17 ENCOUNTER — Encounter: Payer: Self-pay | Admitting: Certified Nurse Midwife

## 2019-05-17 ENCOUNTER — Ambulatory Visit: Payer: Self-pay

## 2019-05-17 ENCOUNTER — Ambulatory Visit (INDEPENDENT_AMBULATORY_CARE_PROVIDER_SITE_OTHER): Payer: Self-pay | Admitting: Certified Nurse Midwife

## 2019-05-17 VITALS — BP 121/73 | HR 74 | Wt 173.4 lb

## 2019-05-17 DIAGNOSIS — Z3403 Encounter for supervision of normal first pregnancy, third trimester: Secondary | ICD-10-CM

## 2019-05-17 DIAGNOSIS — Z3A35 35 weeks gestation of pregnancy: Secondary | ICD-10-CM

## 2019-05-17 DIAGNOSIS — Z23 Encounter for immunization: Secondary | ICD-10-CM

## 2019-05-17 LAB — POCT URINALYSIS DIPSTICK OB
Bilirubin, UA: NEGATIVE
Blood, UA: NEGATIVE
Glucose, UA: NEGATIVE
Ketones, UA: NEGATIVE
Nitrite, UA: NEGATIVE
POC,PROTEIN,UA: NEGATIVE
Spec Grav, UA: 1.01 (ref 1.010–1.025)
Urobilinogen, UA: 0.2 E.U./dL
pH, UA: 5 (ref 5.0–8.0)

## 2019-05-17 MED ORDER — TETANUS-DIPHTH-ACELL PERTUSSIS 5-2.5-18.5 LF-MCG/0.5 IM SUSP
0.5000 mL | Freq: Once | INTRAMUSCULAR | Status: AC
  Administered 2019-05-17: 0.5 mL via INTRAMUSCULAR

## 2019-05-17 NOTE — Patient Instructions (Signed)
Group B Streptococcus Infection During Pregnancy °Group B Streptococcus (GBS) is a type of bacteria that is often found in healthy people. It is commonly found in the rectum, vagina, and intestines. In people who are healthy and not pregnant, the bacteria rarely cause serious illness or complications. However, women who test positive for GBS during pregnancy can pass the bacteria to the baby during childbirth. This can cause serious infection in the baby after birth. °Women with GBS may also have infections during their pregnancy or soon after childbirth. The infections include urinary tract infections (UTIs) or infections of the uterus. GBS also increases a woman's risk of complications during pregnancy, such as early labor or delivery, miscarriage, or stillbirth. Routine testing for GBS is recommended for all pregnant women. °What are the causes? °This condition is caused by bacteria called Streptococcus agalactiae. °What increases the risk? °You may have a higher risk for GBS infection during pregnancy if you had one during a past pregnancy. °What are the signs or symptoms? °In most cases, GBS infection does not cause symptoms in pregnant women. If symptoms exist, they may include: °· Labor that starts before the 37th week of pregnancy. °· A UTI or bladder infection. This may cause a fever, frequent urination, or pain and burning during urination. °· Fever during labor. There can also be a rapid heartbeat in the mother or baby. °Rare but serious symptoms of a GBS infection in women include: °· Blood infection (septicemia). This may cause fever, chills, or confusion. °· Lung infection (pneumonia). This may cause fever, chills, cough, rapid breathing, chest pain, or difficulty breathing. °· Bone, joint, skin, or soft tissue infection. °How is this diagnosed? °You may be screened for GBS between week 35 and week 37 of pregnancy. If you have symptoms of preterm labor, you may be screened earlier. This condition is  diagnosed based on lab test results from: °· A swab of fluid from the vagina and rectum. °· A urine sample. °How is this treated? °This condition is treated with antibiotic medicine. Antibiotic medicine may be given: °· To you when you go into labor, or as soon as your water breaks. The medicines will continue until after you give birth. If you are having a cesarean delivery, you do not need antibiotics unless your water has broken. °· To your baby, if he or she requires treatment. Your health care provider will check your baby to decide if he or she needs antibiotics to prevent a serious infection. °Follow these instructions at home: °· Take over-the-counter and prescription medicines only as told by your health care provider. °· Take your antibiotic medicine as told by your health care provider. Do not stop taking the antibiotic even if you start to feel better. °· Keep all pre-birth (prenatal) visits and follow-up visits as told by your health care provider. This is important. °Contact a health care provider if: °· You have pain or burning when you urinate. °· You have to urinate more often than usual. °· You have a fever or chills. °· You develop a bad-smelling vaginal discharge. °Get help right away if: °· Your water breaks. °· You go into labor. °· You have severe pain in your abdomen. °· You have difficulty breathing. °· You have chest pain. °These symptoms may represent a serious problem that is an emergency. Do not wait to see if the symptoms will go away. Get medical help right away. Call your local emergency services (911 in the [place] Do not drive yourself to   the hospital. Summary  GBS is a type of bacteria that is common in healthy people.  During pregnancy, colonization with GBS can cause serious complications for you or your baby.  Your health care provider will screen you between 35 and 37 weeks of pregnancy to determine if you are colonized with GBS.  If you are colonized with GBS during  pregnancy, your health care provider will recommend antibiotics through an IV during labor.  After delivery, your baby will be evaluated for complications related to potential GBS infection and may require antibiotics to prevent a serious infection. This information is not intended to replace advice given to you by your health care provider. Make sure you discuss any questions you have with your health care provider. Document Revised: 09/12/2018 Document Reviewed: 09/12/2018 Elsevier Patient Education  2020 [employer] Braxton Hicks Contractions Contractions of the uterus can occur throughout pregnancy, but they are not always a sign that you are in labor. You may have practice contractions called Braxton Hicks contractions. These false labor contractions are sometimes confused with true labor. What are Braxton Hicks contractions? Braxton Hicks contractions are tightening movements that occur in the muscles of the uterus before labor. Unlike true labor contractions, these contractions do not result in opening (dilation) and thinning of the cervix. Toward the end of pregnancy (32-34 weeks), Braxton Hicks contractions can happen more often and may become stronger. These contractions are sometimes difficult to tell apart from true labor because they can be very uncomfortable. You should not feel embarrassed if you go to the hospital with false labor. Sometimes, the only way to tell if you are in true labor is for your health care provider to look for changes in the cervix. The health care provider will do a physical exam and may monitor your contractions. If you are not in true labor, the exam should show that your cervix is not dilating and your water has not broken. If there are no other health problems associated with your pregnancy, it is completely safe for you to be sent home with false labor. You may continue to have Braxton Hicks contractions until you go into true labor. How to tell the  difference between true labor and false labor True labor  Contractions last 30-70 seconds.  Contractions become very regular.  Discomfort is usually felt in the top of the uterus, and it spreads to the lower abdomen and low back.  Contractions do not go away with walking.  Contractions usually become more intense and increase in frequency.  The cervix dilates and gets thinner. False labor  Contractions are usually shorter and not as strong as true labor contractions.  Contractions are usually irregular.  Contractions are often felt in the front of the lower abdomen and in the groin.  Contractions may go away when you walk around or change positions while lying down.  Contractions get weaker and are shorter-lasting as time goes on.  The cervix usually does not dilate or become thin. Follow these instructions at home:   Take over-the-counter and prescription medicines only as told by your health care provider.  Keep up with your usual exercises and follow other instructions from your health care provider.  Eat and drink lightly if you think you are going into labor.  If Braxton Hicks contractions are making you uncomfortable: ? Change your position from lying down or resting to walking, or change from walking to resting. ? Sit and rest in a tub of warm water. ? Drink   enough fluid to keep your urine pale yellow. Dehydration may cause these contractions. ? Do slow and deep breathing several times an hour.  Keep all follow-up prenatal visits as told by your health care provider. This is important. Contact a health care provider if:  You have a fever.  You have continuous pain in your abdomen. Get help right away if:  Your contractions become stronger, more regular, and closer together.  You have fluid leaking or gushing from your vagina.  You pass blood-tinged mucus (bloody show).  You have bleeding from your vagina.  You have low back pain that you never had  before.  You feel your baby's head pushing down and causing pelvic pressure.  Your baby is not moving inside you as much as it used to. Summary  Contractions that occur before labor are called Braxton Hicks contractions, false labor, or practice contractions.  Braxton Hicks contractions are usually shorter, weaker, farther apart, and less regular than true labor contractions. True labor contractions usually become progressively stronger and regular, and they become more frequent.  Manage discomfort from [facility] contractions by changing position, resting in a warm bath, drinking plenty of water, or practicing deep breathing. This information is not intended to replace advice given to you by your health care provider. Make sure you discuss any questions you have with your health care provider. Document Revised: 01/29/2017 Document Reviewed: 07/02/2016 Elsevier Patient Education  2020 [employer].

## 2019-05-17 NOTE — Progress Notes (Signed)
ROB doing well. Feels good movement. Reviewed birth plan. Pt is interested in water birth. State that she got the information from [facility] last visit. Recommend that she complete test and secure tub. Discussed that if I am on call that she may have to exit water for the birth. She verbalizes and agrees to plan. Discussed GBS testing next visit. Pt state she had syncapal episode this past week [place] [employer] [place]. Discussed not standing /sitting for excessive amounts of time. Standing slowly, staying hydrated and eating small frequent meals. She verbalizes and agrees to plan. Follow up 1 wk with Marcelino Duster.

## 2019-05-18 LAB — VARICELLA ZOSTER ANTIBODY, IGG: Varicella zoster IgG: 285 index (ref >165)

## 2019-05-19 ENCOUNTER — Other Ambulatory Visit: Payer: Self-pay | Admitting: Certified Nurse Midwife

## 2019-05-19 ENCOUNTER — Telehealth: Payer: Self-pay | Admitting: Certified Nurse Midwife

## 2019-05-19 LAB — URINE CULTURE

## 2019-05-19 MED ORDER — NITROFURANTOIN MONOHYD MACRO 100 MG PO CAPS: 100.0000 mg | ORAL_CAPSULE | Freq: Two times a day (BID) | ORAL | 0 refills | Status: AC

## 2019-05-19 MED ORDER — NITROFURANTOIN MONOHYD MACRO 100 MG PO CAPS: 100.0000 mg | ORAL_CAPSULE | Freq: Every day | ORAL | 0 refills | Status: DC

## 2019-05-19 NOTE — Telephone Encounter (Signed)
Called pt [facility] for anatomy scan for next visit. Pt needs to return call to schedule

## 2019-05-26 ENCOUNTER — Encounter: Payer: Self-pay | Admitting: Certified Nurse Midwife

## 2019-05-29 ENCOUNTER — Encounter: Payer: Self-pay | Admitting: General Practice

## 2019-05-30 ENCOUNTER — Other Ambulatory Visit: Payer: Self-pay

## 2019-06-19 ENCOUNTER — Inpatient Hospital Stay: Admit: 2019-06-19 | Payer: Self-pay

## 2020-01-23 IMAGING — US US OB LIMITED
1 series · 14 of 28 positions shown · non-contrast
Comparison: none

CLINICAL DATA: Syncope

EXAM:
LIMITED OBSTETRIC ULTRASOUND

[Series 1: us ob limited · 0.25mm/px · 14 of 35 slices shown]
[im 2/35]
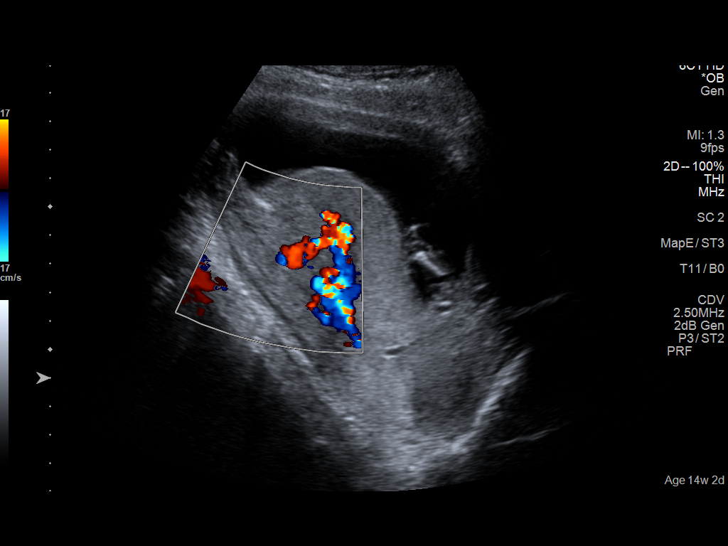
[im 4/35]
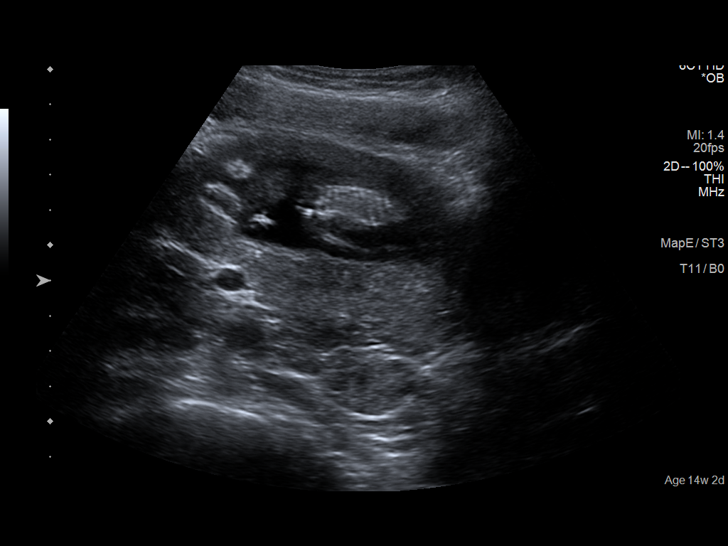
[im 7/35]
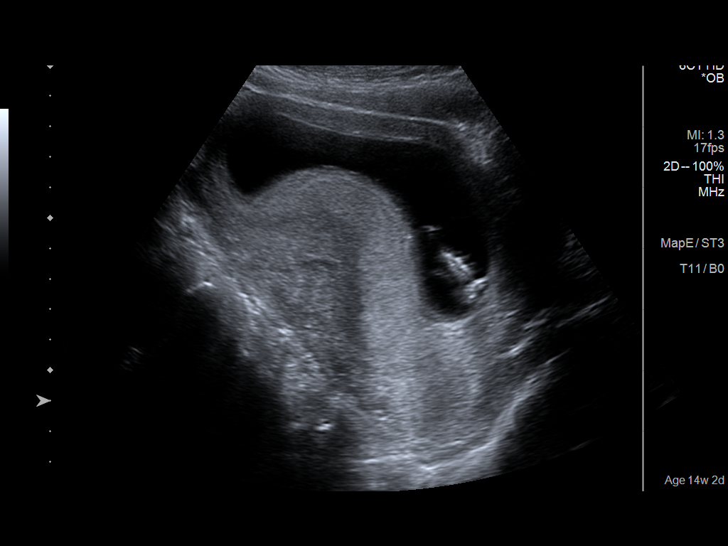
[im 9/35]
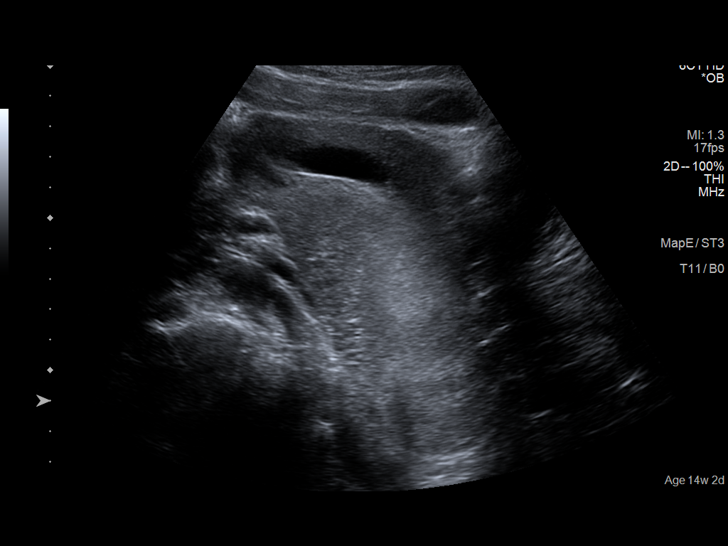
[im 12/35]
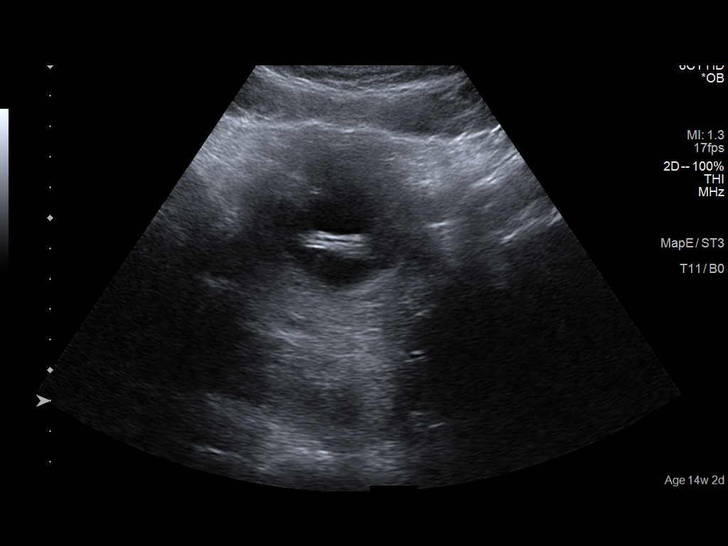
[im 14/35]
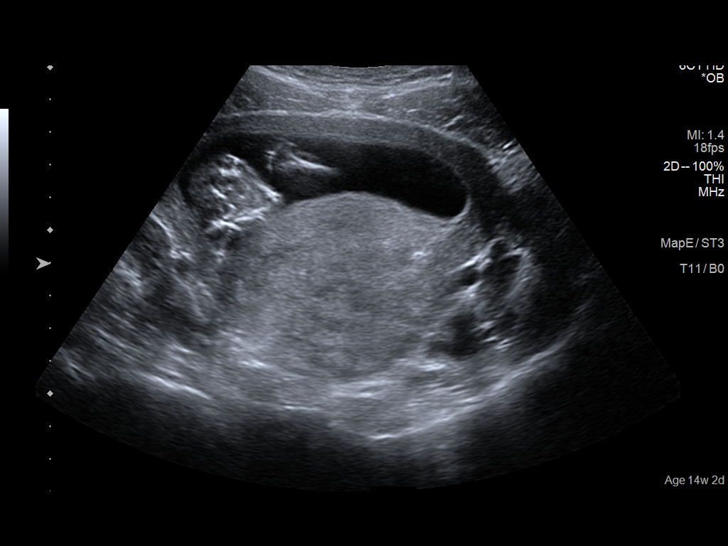
[im 17/35]
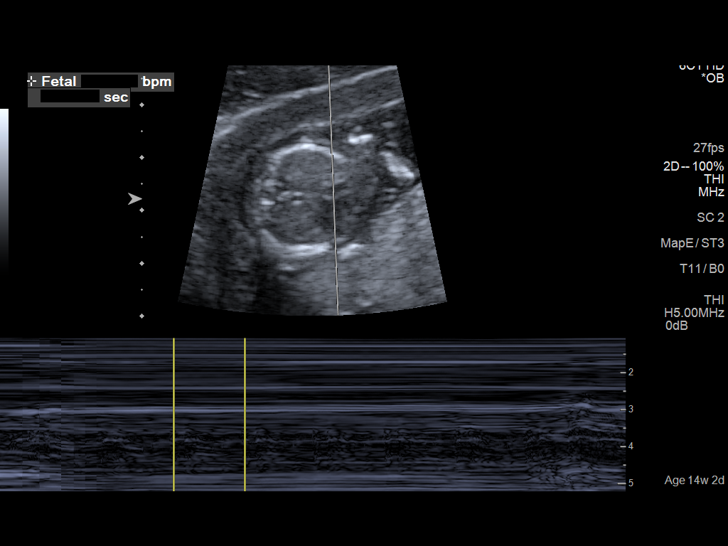
[im 19/35]
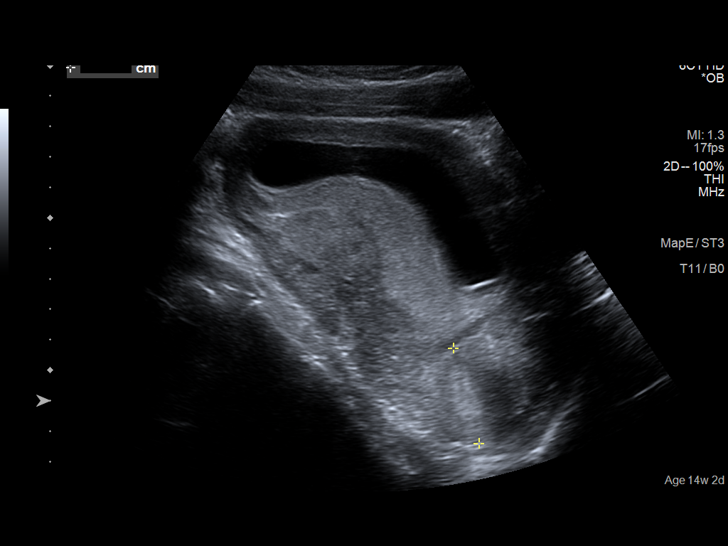
[im 22/35]
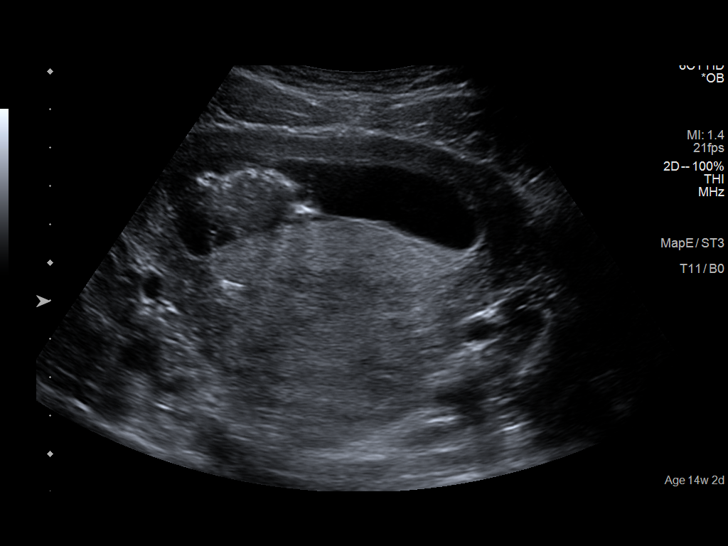
[im 24/35]
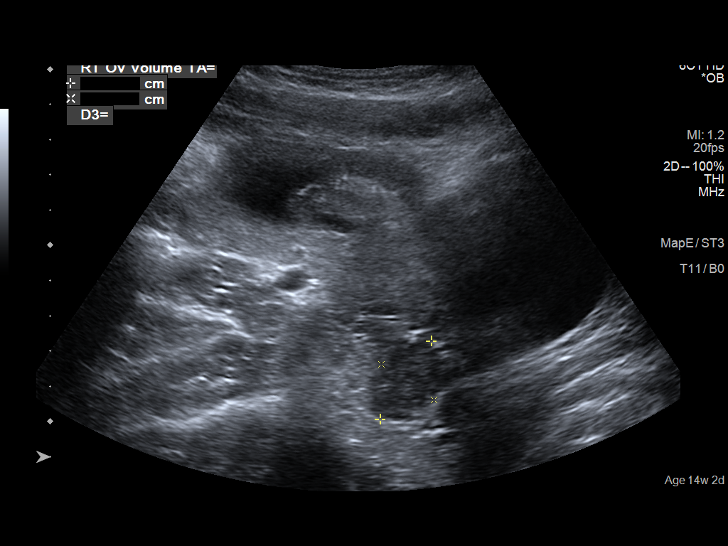
[im 27/35]
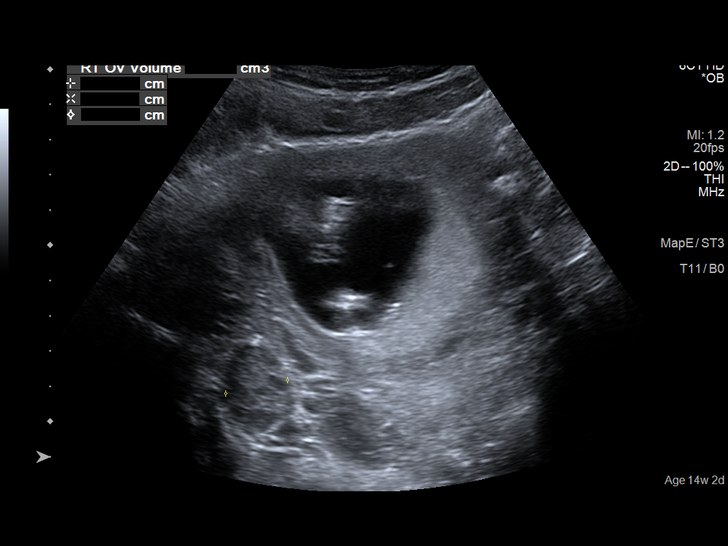
[im 29/35]
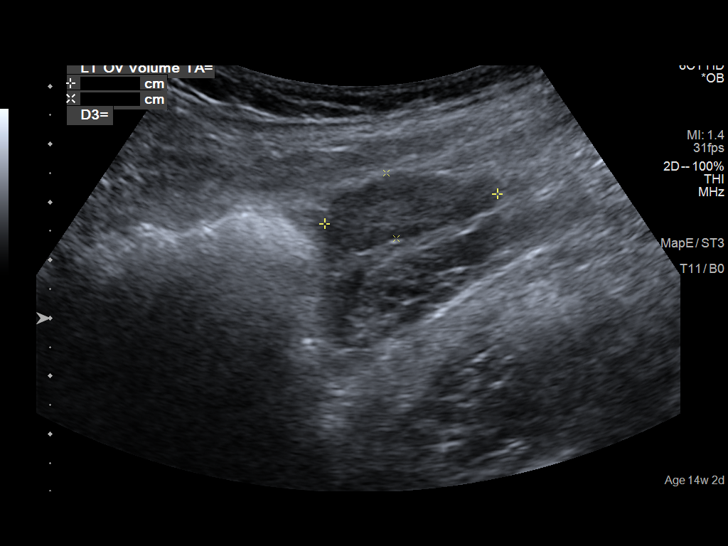
[im 32/35]
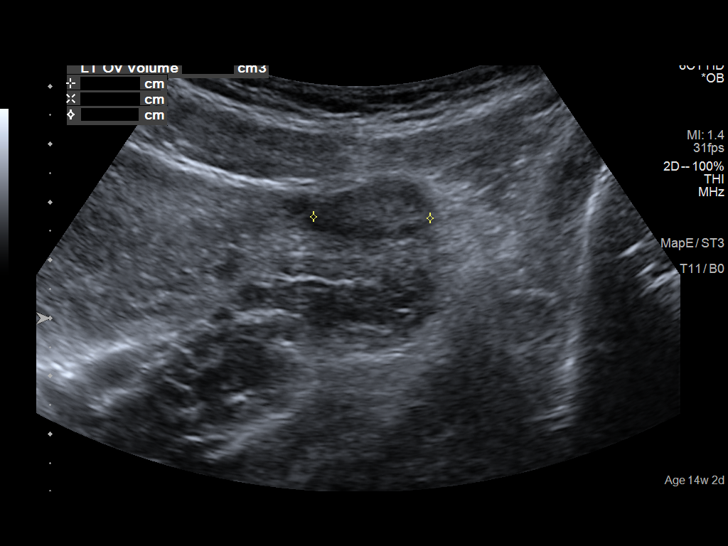
[im 35/35]
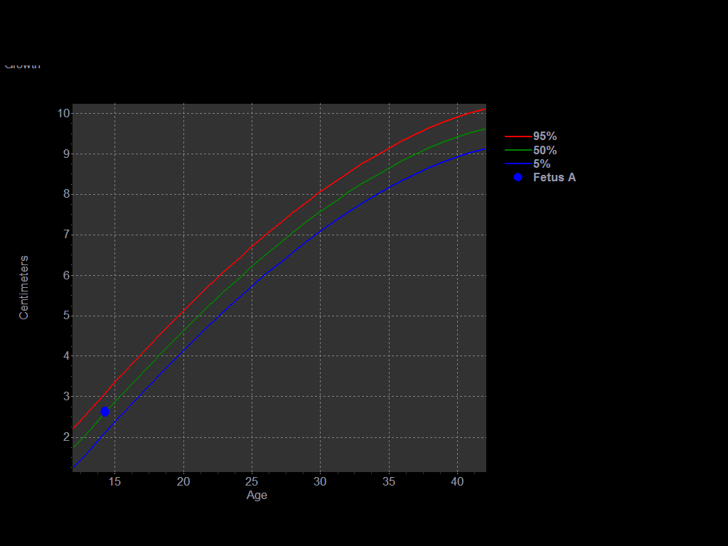

[14 of 28 positions shown; findings below may reference images not displayed]

FINDINGS: Number of Fetuses: 1

Heart Rate:  144 bpm

Movement: Yes

Presentation: Breech

Placental Location: Posterior

Previa: Complete

Amniotic Fluid (Subjective):  Within normal limits.

BPD: 2.6 cm 14 w  4 d

MATERNAL FINDINGS:

Cervix:  Appears closed.

Uterus/Adnexae: No abnormality visualized.
IMPRESSION: Single live intrauterine pregnancy measuring 14 weeks 4 days.

Complete previa noted which may be due to early gestational age.

This exam is performed on an emergent basis and does not
comprehensively evaluate fetal size, dating, or anatomy; follow-up
complete OB US should be considered if further fetal assessment is
warranted.

## 2020-01-23 IMAGING — CT CT ANGIO CHEST
2 of 6 series · 18 of 46 positions shown · IV contrast (APPLIED)
Comparison: None.

CLINICAL DATA: Syncope. Pregnant patient at 14 weeks gestation.

EXAM:
CT ANGIOGRAPHY CHEST WITH CONTRAST
TECHNIQUE: Multidetector CT imaging of the chest was performed using the
standard protocol during bolus administration of intravenous
contrast. Multiplanar CT image reconstructions and MIPs were
obtained to evaluate the vascular anatomy. Referring clinician
reviewed radiation risks prior to exam. Patient was shielded.
CONTRAST:  75mL OMNIPAQUE IOHEXOL 350 MG/ML SOLN

[Series 5: thins · axial · 0.63mm/px · z∈[-541,-315]mm · 15 of 248 slices shown]
[im 11/248  lung]
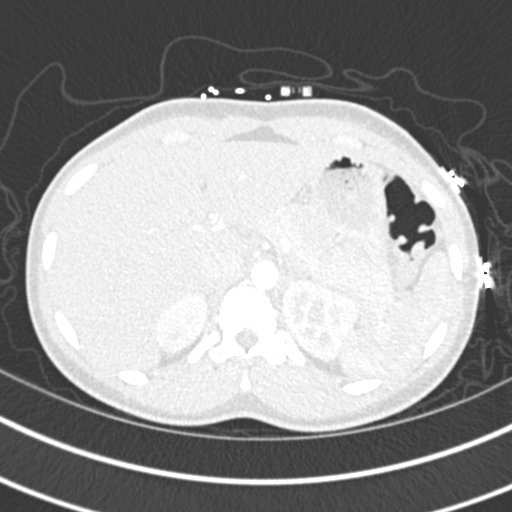
[im 33/248  soft-tissue]
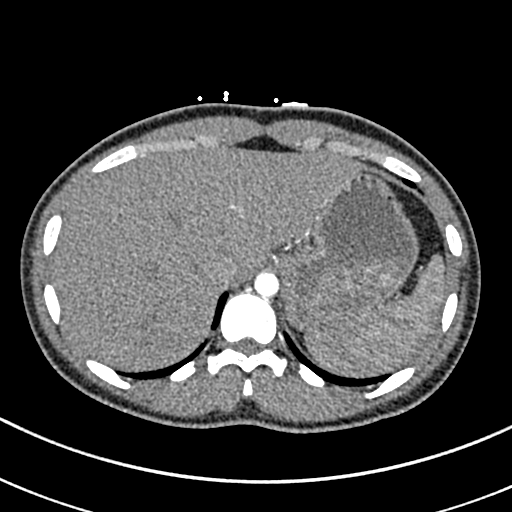
[im 43/248  lung]
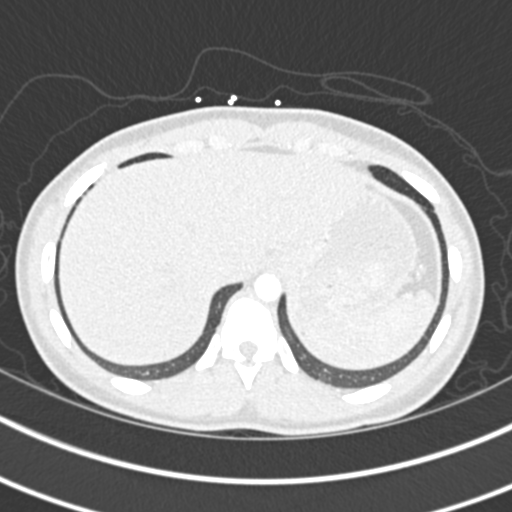
[im 65/248  soft-tissue]
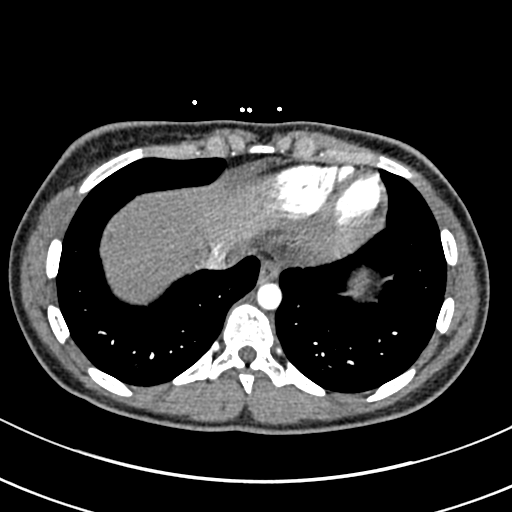
[im 76/248  lung]
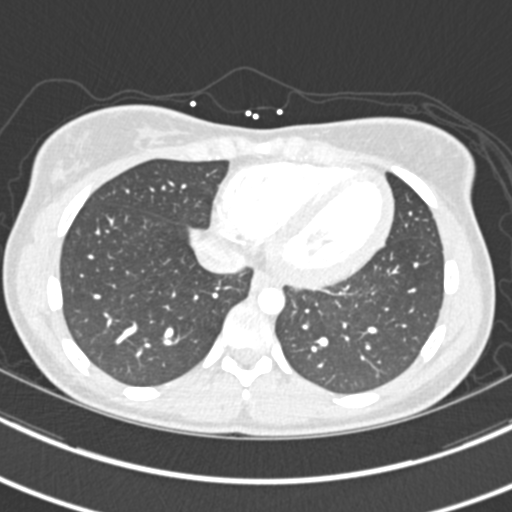
[im 97/248  soft-tissue]
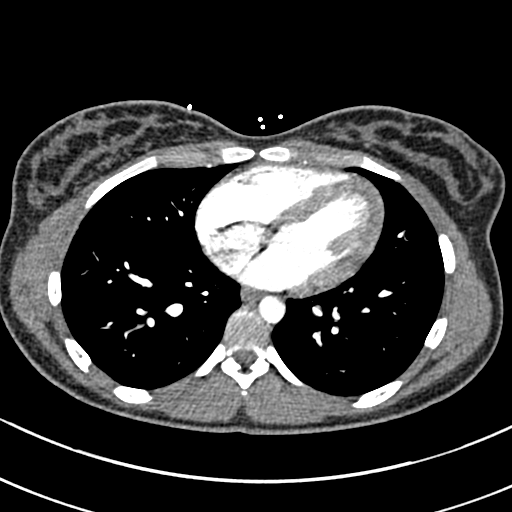
[im 108/248  lung]
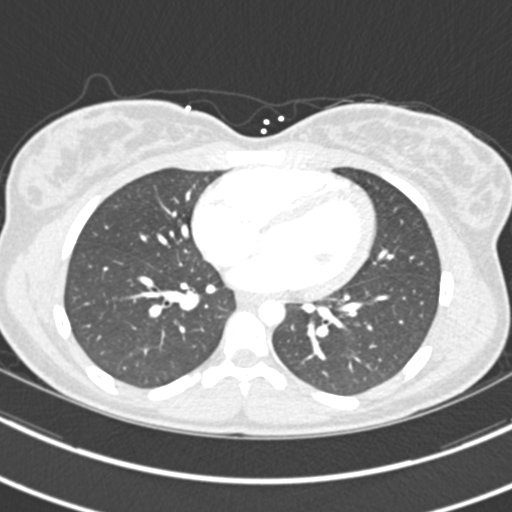
[im 129/248  soft-tissue]
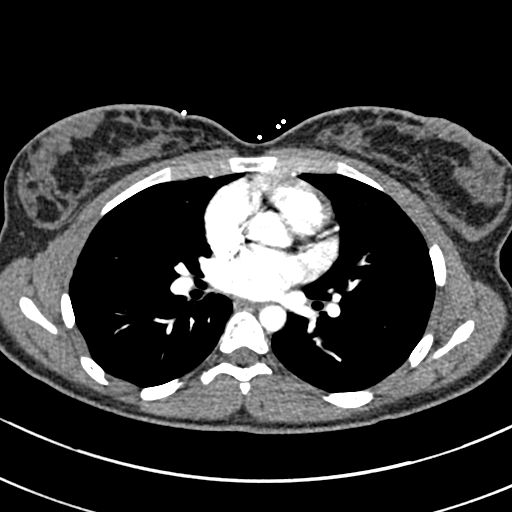
[im 140/248  lung]
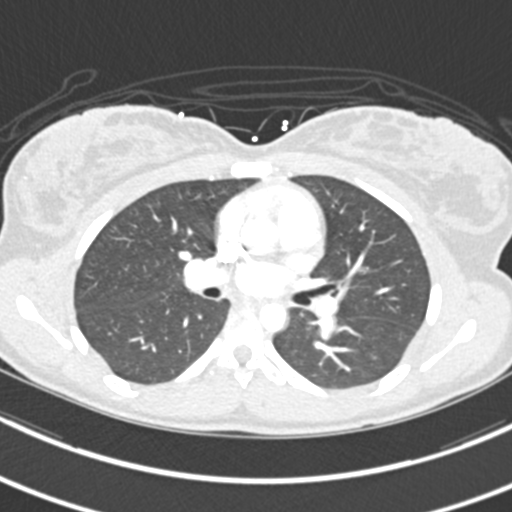
[im 151/248  soft-tissue]
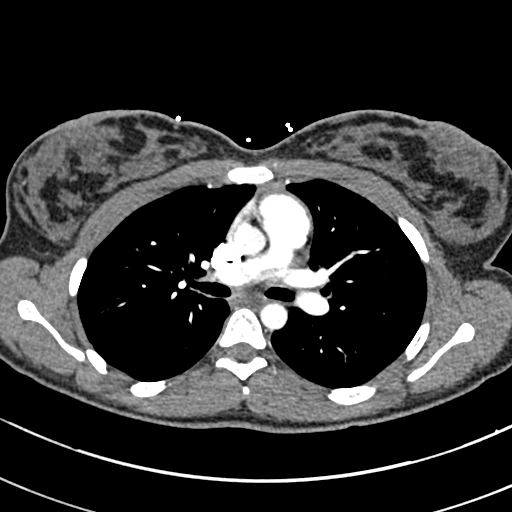
[im 172/248  lung]
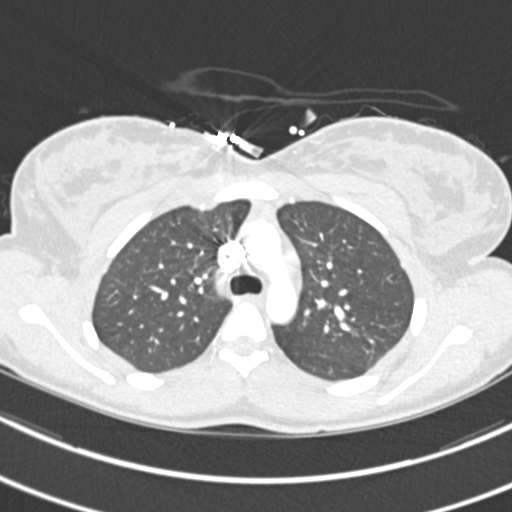
[im 183/248  soft-tissue]
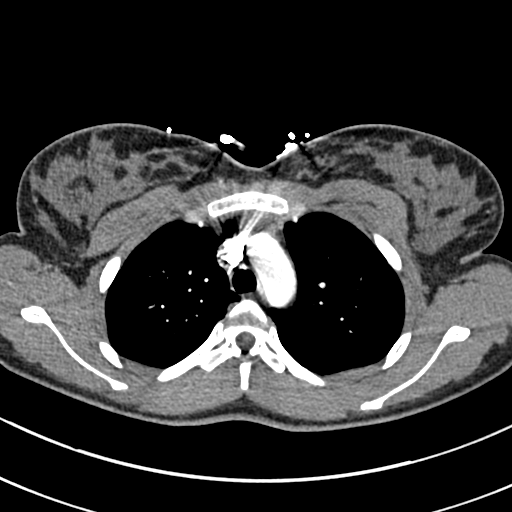
[im 205/248  lung]
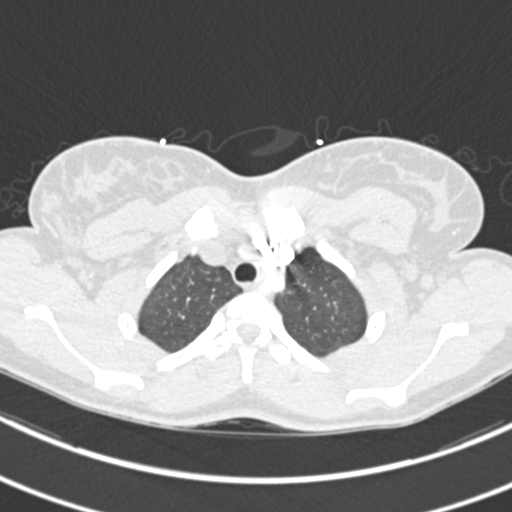
[im 215/248  soft-tissue]
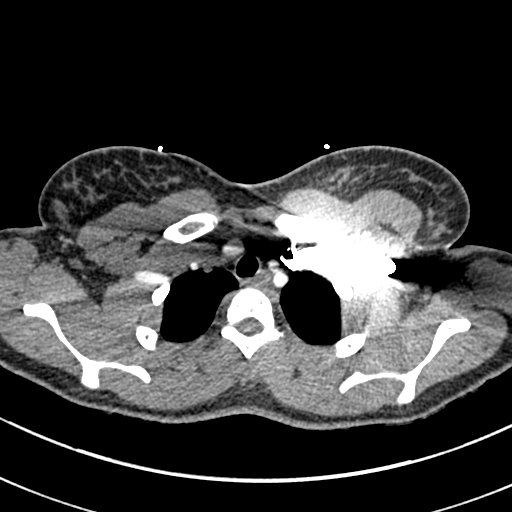
[im 237/248  lung]
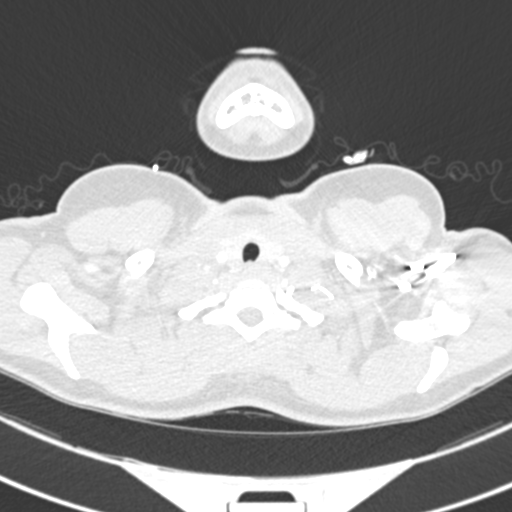

[Series 7: coronal mpr · coronal · 0.49mm/px · 3 of 68 slices shown]
[im 17/68  soft-tissue]
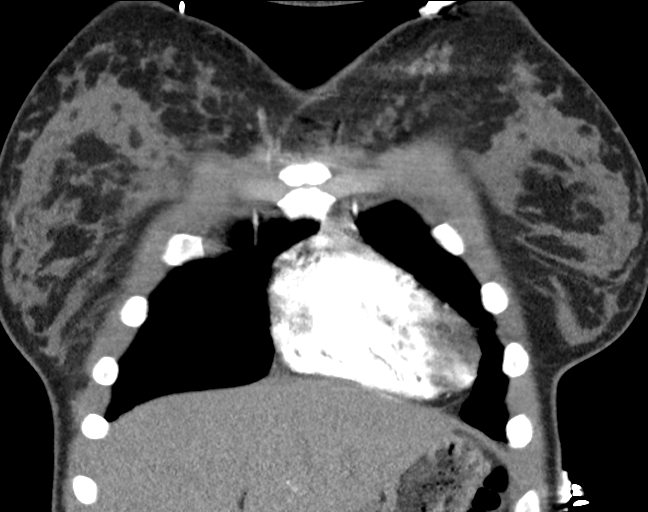
[im 34/68  soft-tissue]
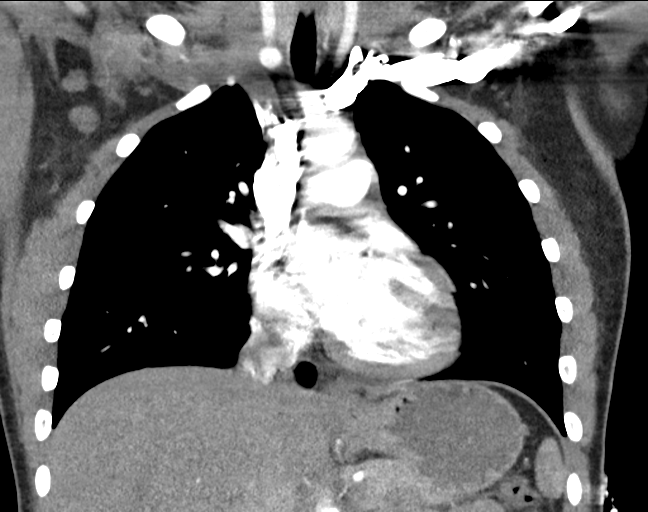
[im 51/68  soft-tissue]
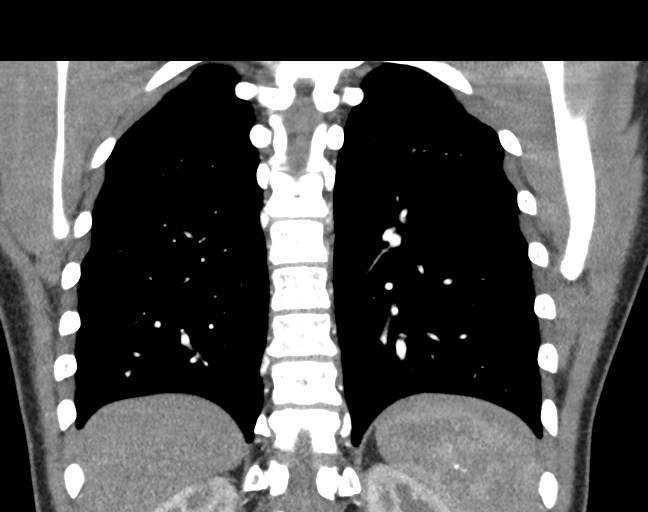

[18 of 46 positions shown; findings below may reference images not displayed]

FINDINGS: Cardiovascular: There are no filling defects within the pulmonary
arteries to suggest pulmonary embolus. No aortic dissection. Heart
is normal in size. No pericardial effusion.

Mediastinum/Nodes: No enlarged mediastinal or hilar lymph nodes. No
visualized thyroid nodule. Decompressed esophagus.

Lungs/Pleura: Lungs are clear. No focal airspace disease, pleural
fluid, or pulmonary edema.

Upper Abdomen: No acute abnormality.

Musculoskeletal: No chest wall abnormality. No acute or significant
osseous findings.

Review of the MIP images confirms the above findings.
IMPRESSION: Normal CTA of the chest. No pulmonary embolus or acute intrathoracic
abnormality.

## 2020-04-11 ENCOUNTER — Ambulatory Visit
Admission: EM | Admit: 2020-04-11 | Discharge: 2020-04-11 | Disposition: A | Discharge status: Home / Self Care | Payer: Medicaid Other | Attending: Sports Medicine | Admitting: Sports Medicine

## 2020-04-11 ENCOUNTER — Encounter: Payer: Self-pay | Admitting: Emergency Medicine

## 2020-04-11 ENCOUNTER — Other Ambulatory Visit: Payer: Self-pay

## 2020-04-11 ENCOUNTER — Ambulatory Visit: Payer: Medicaid Other | Attending: Sports Medicine

## 2020-04-11 ENCOUNTER — Telehealth: Payer: Self-pay | Admitting: Emergency Medicine

## 2020-04-11 DIAGNOSIS — O26891 Other specified pregnancy related conditions, first trimester: Secondary | ICD-10-CM | POA: Insufficient documentation

## 2020-04-11 DIAGNOSIS — R102 Pelvic and perineal pain: Secondary | ICD-10-CM | POA: Insufficient documentation

## 2020-04-11 DIAGNOSIS — R112 Nausea with vomiting, unspecified: Secondary | ICD-10-CM | POA: Diagnosis not present

## 2020-04-11 DIAGNOSIS — O23591 Infection of other part of genital tract in pregnancy, first trimester: Secondary | ICD-10-CM | POA: Insufficient documentation

## 2020-04-11 DIAGNOSIS — O23599 Infection of other part of genital tract in pregnancy, unspecified trimester: Secondary | ICD-10-CM

## 2020-04-11 DIAGNOSIS — O26811 Pregnancy related exhaustion and fatigue, first trimester: Secondary | ICD-10-CM | POA: Diagnosis not present

## 2020-04-11 DIAGNOSIS — Z20822 Contact with and (suspected) exposure to covid-19: Secondary | ICD-10-CM | POA: Diagnosis not present

## 2020-04-11 DIAGNOSIS — Z3A01 Less than 8 weeks gestation of pregnancy: Secondary | ICD-10-CM | POA: Insufficient documentation

## 2020-04-11 DIAGNOSIS — B9689 Other specified bacterial agents as the cause of diseases classified elsewhere: Secondary | ICD-10-CM | POA: Insufficient documentation

## 2020-04-11 DIAGNOSIS — O219 Vomiting of pregnancy, unspecified: Secondary | ICD-10-CM | POA: Diagnosis not present

## 2020-04-11 LAB — PREGNANCY, URINE: Preg Test, Ur: POSITIVE — AB

## 2020-04-11 LAB — URINALYSIS, COMPLETE (UACMP) WITH MICROSCOPIC
Bilirubin Urine: NEGATIVE
Glucose, UA: NEGATIVE mg/dL
Hgb urine dipstick: NEGATIVE
Ketones, ur: NEGATIVE mg/dL
Leukocytes,Ua: NEGATIVE
Nitrite: NEGATIVE
Protein, ur: NEGATIVE mg/dL
RBC / HPF: NONE SEEN RBC/hpf (ref 0–5)
Specific Gravity, Urine: 1.02 (ref 1.005–1.030)
pH: 7.5 (ref 5.0–8.0)

## 2020-04-11 LAB — CHLAMYDIA/NGC RT PCR (ARMC ONLY)
Chlamydia Tr: NOT DETECTED
N gonorrhoeae: NOT DETECTED

## 2020-04-11 MED ORDER — METRONIDAZOLE 500 MG PO TABS: 500.0000 mg | ORAL_TABLET | Freq: Two times a day (BID) | ORAL | 0 refills | Status: AC

## 2020-04-11 NOTE — Telephone Encounter (Signed)
Ultrasound called stating pt did not show for her appt. Tried to call patient and numbers in chart are not available at this time.

## 2020-04-11 NOTE — ED Provider Notes (Signed)
[facility]    CSN: [id number] Arrival date & time: 04/11/20  1258      History   Chief Complaint Chief Complaint  Patient presents with  . Emesis  . Fatigue    HPI Kendra Hunt is a 20 y.o. female.   Patient pleasant 20 year old female who presents for evaluation of the above issues.  She reports 1 week of lower abdominal pain over the bladder and suprapubic area and a little bit on the right and left lower quadrants.  She has had associated nausea with 2 episodes of emesis in the morning.  She is also had decreased appetite a little bit of a headache and has been lightheaded.  She says overall she just feels tired all the time.  She denies any chest pain shortness of breath cough or any other Covid-like symptoms.  She has not been vaccinated against COVID or influenza.  She denies any diarrhea fever shakes or chills.  Her last menstrual period was December 15 and she is sexually active without the use of contraception.  She is also noted a whitish discharge at times but no foul-smelling odors and no bleeding.  No recent antibiotics.  She also reports occasional left-sided rib pain that seems to be worse with any sort of twisting motions.  It is not constant.  She denies any hematuria dysuria increased urinary frequency or flank pain.  She did have a child about a year ago and was followed by a local OB GYN group however she ended up going back to [place] where she is originally from and had the baby there.  Her mother lives here locally in [place] so she moved back for her mom to assist her with childcare.  She denies any fever shakes chills.  No red flag signs or symptoms elicited on history.     History reviewed. No pertinent past medical history.  Patient Active Problem List   Diagnosis Date Noted  . Varicella vaccination status unknown 05/07/2019  . Anemia during pregnancy in third trimester 05/07/2019  . Urinary tract infection in mother during second  trimester of pregnancy 05/07/2019  . Family history of sickle cell trait in mother 05/07/2019  . History of seizures 05/07/2019    History reviewed. No pertinent surgical history.  OB History    Gravida  1   Para      Term      Preterm      AB      Living        SAB      IAB      Ectopic      Multiple      Live Births               Home Medications    Prior to Admission medications   Medication Sig Start Date End Date Taking? Authorizing Provider  metroNIDAZOLE (FLAGYL) 500 MG tablet Take 1 tablet (500 mg total) by mouth 2 (two) times daily. 04/11/20  Yes Delton See, MD  ondansetron (ZOFRAN ODT) 4 MG disintegrating tablet Take 1 tablet (4 mg total) by mouth every 8 (eight) hours as needed for nausea or vomiting. Patient not taking: No sig reported 12/23/18   Irean Hong, MD  Prenatal Vit-Fe Fumarate-FA (PRENATAL MULTIVITAMIN) TABS tablet Take 1 tablet by mouth daily at 12 noon.    [provider]  promethazine (PHENERGAN) 12.5 MG tablet Take 1 tablet (12.5 mg total) by mouth every 6 (six) hours as needed  for nausea or vomiting. Patient not taking: No sig reported 12/11/18   Chinita Pester, FNP    Family History History reviewed. No pertinent family history.  Social History Social History   Tobacco Use  . Smoking status: Never Smoker  . Smokeless tobacco: Never Used  Vaping Use  . Vaping Use: Never used  Substance Use Topics  . Alcohol use: Never  . Drug use: Never     Allergies   Patient has no known allergies.   Review of Systems Review of Systems  Constitutional: Positive for appetite change and fatigue. Negative for chills, diaphoresis and fever.  HENT: Negative for congestion, ear pain, postnasal drip, rhinorrhea, sinus pressure, sinus pain, sneezing and sore throat.   Respiratory: Negative for cough, chest tightness, shortness of breath and wheezing.   Cardiovascular: Negative for chest pain and palpitations.   Gastrointestinal: Positive for nausea and vomiting. Negative for abdominal pain, blood in stool, constipation and diarrhea.  Genitourinary: Positive for vaginal discharge. Negative for dysuria, flank pain, frequency, hematuria, pelvic pain, vaginal bleeding and vaginal pain.  Skin: Negative for color change, pallor and wound.  Neurological: Positive for light-headedness and headaches. Negative for tremors, syncope and weakness.  All other systems reviewed and are negative.    Physical Exam Triage Vital Signs ED Triage Vitals  Enc Vitals Group     BP 04/11/20 1317 (!) 147/76     Pulse Rate 04/11/20 1317 74     Resp 04/11/20 1317 18     Temp 04/11/20 1317 98.5 F (36.9 C)     Temp Source 04/11/20 1317 Oral     SpO2 04/11/20 1317 100 %     Weight 04/11/20 1315 173 lb 4.5 oz (78.6 kg)     Height 04/11/20 1315 5\' 6"  (1.676 m)     Head Circumference --      Peak Flow --      Pain Score 04/11/20 1313 7     Pain Loc --      Pain Edu? --      Excl. in GC? --    No data found.  Updated Vital Signs BP (!) 147/76 (BP Location: Left Arm)   Pulse 74   Temp 98.5 F (36.9 C) (Oral)   Resp 18   Ht 5\' 6"  (1.676 m)   Wt 78.6 kg   LMP 02/09/2020 (Approximate)   SpO2 100%   Breastfeeding No   BMI 27.97 kg/m   Visual Acuity Right Eye Distance:   Left Eye Distance:   Bilateral Distance:    Right Eye Near:   Left Eye Near:    Bilateral Near:     Physical Exam Vitals and nursing note reviewed.  Constitutional:      General: She is not in acute distress.    Appearance: Normal appearance. She is not ill-appearing or toxic-appearing.  HENT:     Head: Normocephalic and atraumatic.     Mouth/Throat:     Mouth: Mucous membranes are moist.     Pharynx: Oropharynx is clear. No oropharyngeal exudate.  Eyes:     Extraocular Movements: Extraocular movements intact.     Conjunctiva/sclera: Conjunctivae normal.     Pupils: Pupils are equal, round, and reactive to light.   Cardiovascular:     Rate and Rhythm: Normal rate and regular rhythm.     Pulses: Normal pulses.     Heart sounds: Normal heart sounds. No murmur heard. No friction rub. No gallop.   Pulmonary:  Effort: Pulmonary effort is normal. No respiratory distress.     Breath sounds: Normal breath sounds. No stridor. No wheezing, rhonchi or rales.  Abdominal:     General: Abdomen is protuberant. Bowel sounds are normal. There is no distension or abdominal bruit. There are no signs of injury.     Palpations: Abdomen is soft.     Tenderness: There is abdominal tenderness in the right lower quadrant, suprapubic area and left lower quadrant. There is no right CVA tenderness, guarding or rebound. Negative signs include Murphy's sign, Rovsing's sign, McBurney's sign, psoas sign and obturator sign.  Musculoskeletal:     Cervical back: Normal range of motion and neck supple.  Skin:    General: Skin is warm and dry.     Capillary Refill: Capillary refill takes less than 2 seconds.  Neurological:     General: No focal deficit present.     Mental Status: She is alert and oriented to person, place, and time.     Cranial Nerves: No cranial nerve deficit.     Sensory: No sensory deficit.  Psychiatric:        Mood and Affect: Mood normal.        Behavior: Behavior normal.      UC Treatments / Results  Labs (all labs ordered are listed, but only abnormal results are displayed) Labs Reviewed  URINALYSIS, COMPLETE (UACMP) WITH MICROSCOPIC - Abnormal; Notable for the following components:      Result Value   APPearance HAZY (*)    Bacteria, UA FEW (*)    Trichomonas, UA PRESENT (*)    All other components within normal limits  PREGNANCY, URINE - Abnormal; Notable for the following components:   Preg Test, Ur POSITIVE (*)    All other components within normal limits  SARS CORONAVIRUS 2 (TAT 6-24 HRS)  URINE CULTURE  CHLAMYDIA/NGC RT PCR ([facility] ONLY)    EKG   Radiology No results  found.  Procedures Procedures (including critical care time)  Medications Ordered in UC Medications - No data to display  Initial Impression / Assessment and Plan / UC Course  I have reviewed the triage vital signs and the nursing notes.  Pertinent labs & imaging results that were available during my care of the patient were reviewed by me and considered in my medical decision making (see chart for details).   Clinical impression: 1 week of lower abdominal pain some lightheadedness decreased appetite with nausea and emesis and significant fatigue.  Patient is unvaccinated.  Treatment plan: 1.  The findings and treatment plan were discussed in detail with the patient.  Patient was in agreement. 2.  Recommended getting a urine beta-hCG, UA, GC and chlamydia swab.  The beta-hCG was positive for pregnancy.  Her UA did not look overwhelming with just a few bacteria.  We will send it off for culture.  The urine did show trichomonas consistent with bacterial vaginosis, and will treat with Flagyl.  Prescription was sent to the pharmacy.  Regarding the GC and chlamydia test it was pending at the time of discharge and someone will call her if her results are positive and put her on antibiotics. 3.  With the headache abdominal pain nausea and vomiting I cannot fully rule out Covid and an unvaccinated patient.  We will go ahead and get a Covid test. 4.  Educational handout was provided. 5.  She is going to need to get prenatal vitamins and get back into the OB/GYN.  I gave her  the information to see [facility] and she will call. 6.  Given her abdominal pain and her pregnancy cannot fully rule out an ectopic pregnancy.  I want her to go to the main hospital at [facility].  We made arrangements for her to be seen and they will fit her in to get that ultrasound.  We will call her with the results. 7.  Follow-up here as needed.    Final Clinical Impressions(s) / UC Diagnoses    Final diagnoses:  Suprapubic abdominal pain  Non-intractable vomiting with nausea, unspecified vomiting type  Less than [redacted] weeks gestation of pregnancy  Pregnancy related fatigue in first trimester  Bacterial vaginosis in pregnancy     Discharge Instructions     Your pregnancy test was positive. Since you have abdominal pain in the first trimester we need to do a ultrasound.  Please go directly to [facility] to have this done. Your urine has been sent for culture. Your urine also indicated you have a condition called bacterial vaginosis.  I have prescribed metronidazole to your pharmacy.  Please pick that up. I have provided the contact information for [facility].  Please call them today to establish care and for ongoing care during her pregnancy. Some of your labs are still pending and if there is anything to treat someone will contact you.  I hope you get to feeling better, Dr. Zachery Dauer    ED Prescriptions    Medication Sig Dispense Auth. Provider   metroNIDAZOLE (FLAGYL) 500 MG tablet Take 1 tablet (500 mg total) by mouth 2 (two) times daily. 14 tablet Delton See, MD     PDMP not reviewed this encounter.   Delton See, MD 04/11/20 [phone]

## 2020-04-11 NOTE — Discharge Instructions (Signed)
Your pregnancy test was positive. Since you have abdominal pain in the first trimester we need to do a ultrasound.  Please go directly to [facility] to have this done. Your urine has been sent for culture. Your urine also indicated you have a condition called bacterial vaginosis.  I have prescribed metronidazole to your pharmacy.  Please pick that up. I have provided the contact information for [facility].  Please call them today to establish care and for ongoing care during her pregnancy. Some of your labs are still pending and if there is anything to treat someone will contact you.  I hope you get to feeling better, Dr. Zachery Dauer

## 2020-04-11 NOTE — ED Triage Notes (Signed)
Pt c/o fatigue, lower abdominal pain, nausea, vomiting and lightheaded. Started about a week ago. She states she also had a pain in her rib cage that is sharp when she takes a deep breath in.

## 2020-04-12 LAB — SARS CORONAVIRUS 2 (TAT 6-24 HRS): SARS Coronavirus 2: NEGATIVE

## 2020-04-13 LAB — URINE CULTURE: Special Requests: NORMAL

## 2020-05-04 DIAGNOSIS — F4312 Post-traumatic stress disorder, chronic: Secondary | ICD-10-CM | POA: Diagnosis not present

## 2020-05-07 DIAGNOSIS — F4312 Post-traumatic stress disorder, chronic: Secondary | ICD-10-CM | POA: Diagnosis not present
